# Patient Record
Sex: Female | Born: 1975 | Race: White | Hispanic: No | Marital: Married | Smoking: Former smoker
Health system: Southern US, Community
[De-identification: ages and names within clinical notes are randomized; demographics above are authoritative.]

## PROBLEM LIST (undated history)

## (undated) DIAGNOSIS — Z789 Other specified health status: Secondary | ICD-10-CM

## (undated) HISTORY — PX: TONSILLECTOMY: SUR1361

## (undated) HISTORY — PX: APPENDECTOMY: SHX54

## (undated) HISTORY — PX: COSMETIC SURGERY: SHX468

## (undated) HISTORY — PX: BREAST SURGERY: SHX581

---

## 2009-06-05 ENCOUNTER — Ambulatory Visit: Payer: Self-pay | Admitting: Diagnostic Radiology

## 2009-06-05 ENCOUNTER — Emergency Department (HOSPITAL_BASED_OUTPATIENT_CLINIC_OR_DEPARTMENT_OTHER): Admission: EM | Admit: 2009-06-05 | Discharge: 2009-06-05 | Payer: Self-pay | Admitting: Emergency Medicine

## 2010-06-09 IMAGING — CR DG CHEST 2V
2 series · 2 of 2 positions shown · non-contrast
Comparison: None

CLINICAL DATA: Cough with congestion; difficulty breathing.

CHEST - 2 VIEW

[w chest pa]
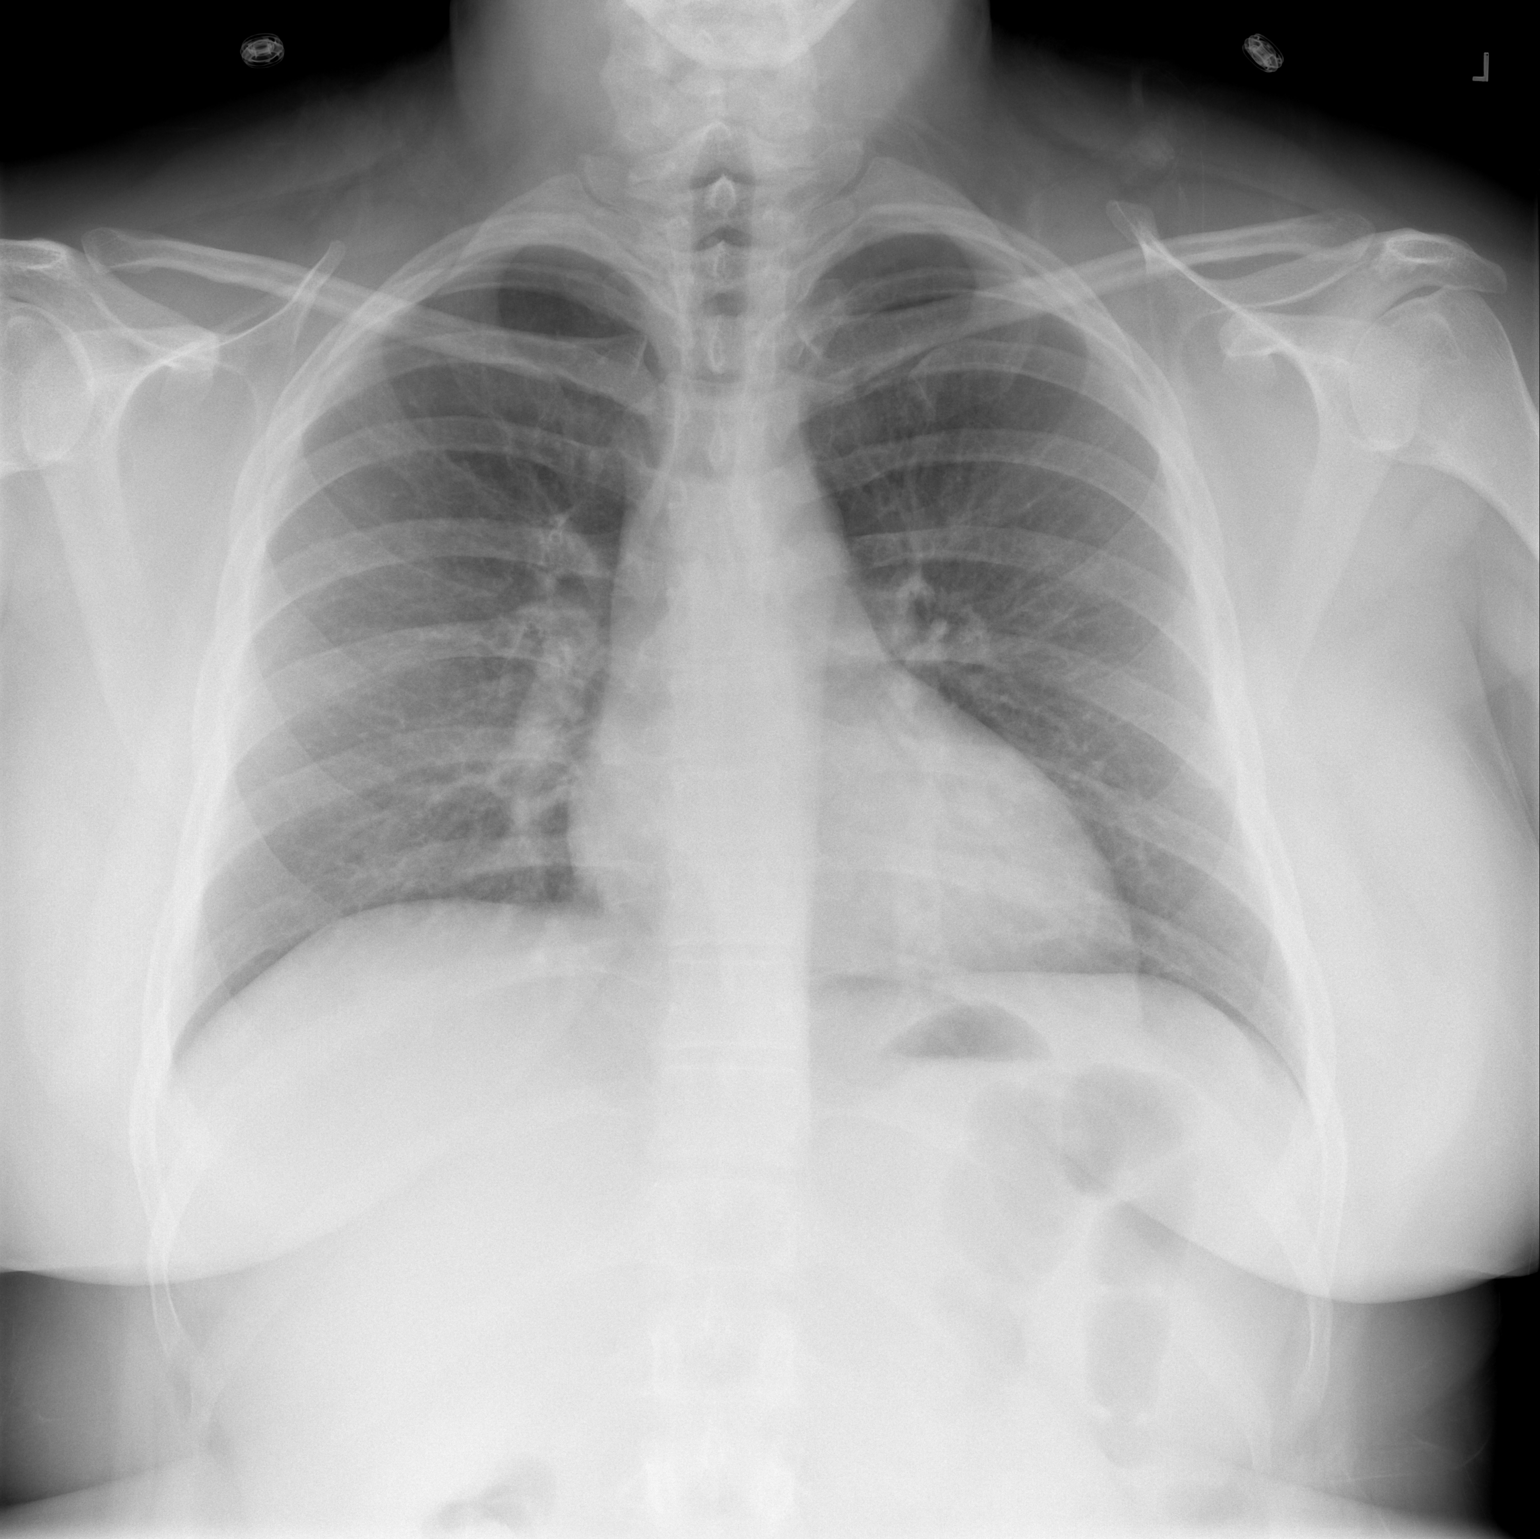

[w chest lat]
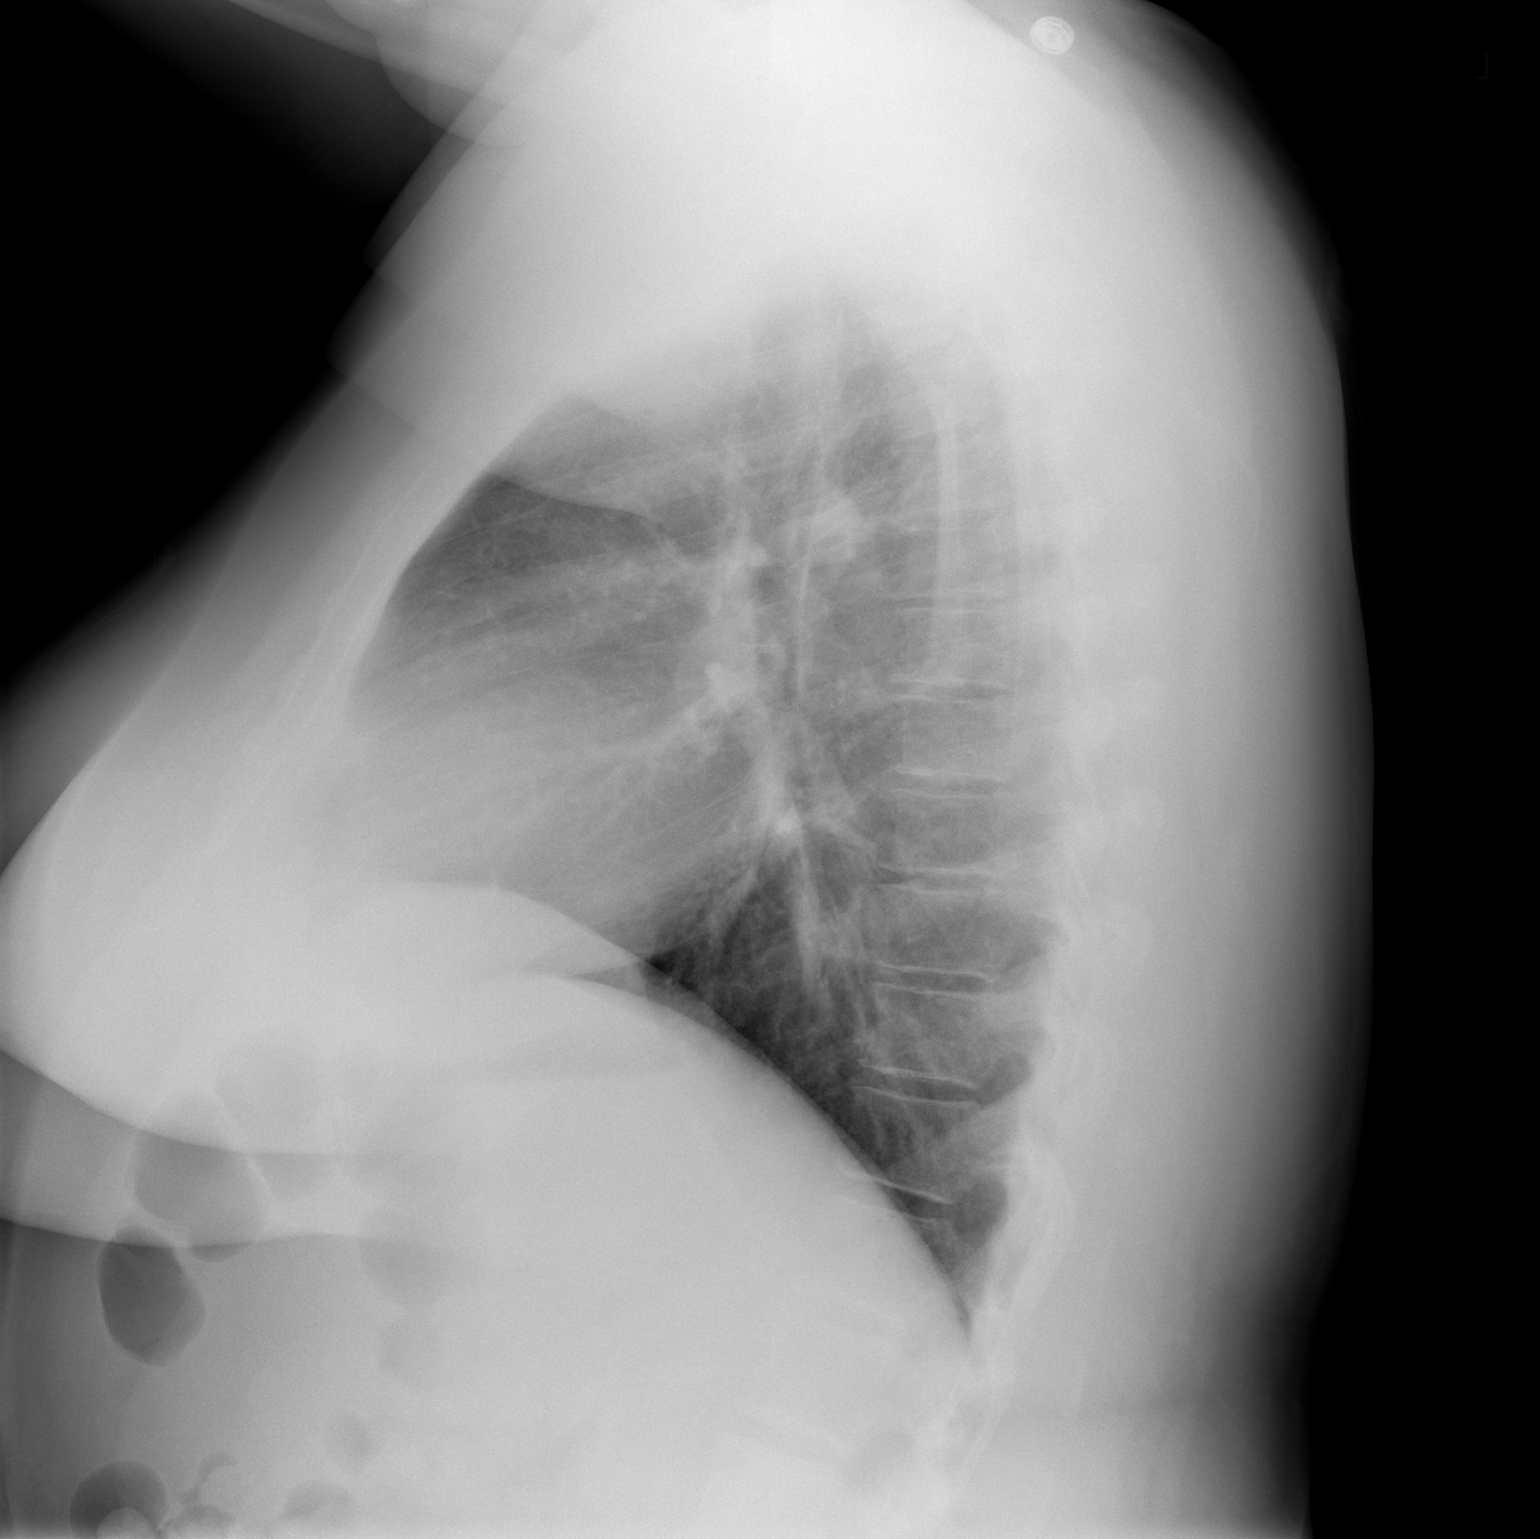

[2 of 2 positions shown; findings below may reference images not displayed]

FINDINGS: The lungs are well-aerated and clear.  There is no
evidence of focal opacification, pleural effusion or pneumothorax.

The heart is normal in size; the mediastinal contour is within
normal limits.  No acute osseous abnormalities are seen.
IMPRESSION: No acute cardiopulmonary process seen.

## 2016-05-06 ENCOUNTER — Encounter (HOSPITAL_COMMUNITY): Payer: Self-pay | Admitting: Obstetrics and Gynecology

## 2016-05-06 ENCOUNTER — Other Ambulatory Visit (HOSPITAL_COMMUNITY): Payer: Self-pay | Admitting: Obstetrics and Gynecology

## 2016-05-06 DIAGNOSIS — Z3A13 13 weeks gestation of pregnancy: Secondary | ICD-10-CM

## 2016-05-06 DIAGNOSIS — Z3682 Encounter for antenatal screening for nuchal translucency: Secondary | ICD-10-CM

## 2016-05-20 ENCOUNTER — Encounter (HOSPITAL_COMMUNITY): Payer: Self-pay | Admitting: *Deleted

## 2016-05-21 ENCOUNTER — Ambulatory Visit (HOSPITAL_COMMUNITY)
Admission: RE | Admit: 2016-05-21 | Discharge: 2016-05-21 | Disposition: A | Discharge status: Home / Self Care | Payer: BLUE CROSS/BLUE SHIELD | Source: Ambulatory Visit | Attending: Obstetrics and Gynecology | Admitting: Obstetrics and Gynecology

## 2016-05-21 ENCOUNTER — Other Ambulatory Visit (HOSPITAL_COMMUNITY): Payer: Self-pay | Admitting: Obstetrics and Gynecology

## 2016-05-21 ENCOUNTER — Encounter (HOSPITAL_COMMUNITY): Payer: Self-pay

## 2016-05-21 VITALS — BP 104/59 | HR 78 | Wt 194.2 lb

## 2016-05-21 DIAGNOSIS — O09529 Supervision of elderly multigravida, unspecified trimester: Secondary | ICD-10-CM

## 2016-05-21 DIAGNOSIS — Z3A13 13 weeks gestation of pregnancy: Secondary | ICD-10-CM

## 2016-05-21 DIAGNOSIS — O09521 Supervision of elderly multigravida, first trimester: Secondary | ICD-10-CM | POA: Insufficient documentation

## 2016-05-21 DIAGNOSIS — O99841 Bariatric surgery status complicating pregnancy, first trimester: Secondary | ICD-10-CM

## 2016-05-21 DIAGNOSIS — Z3682 Encounter for antenatal screening for nuchal translucency: Secondary | ICD-10-CM

## 2016-05-21 DIAGNOSIS — Z36 Encounter for antenatal screening of mother: Secondary | ICD-10-CM | POA: Diagnosis not present

## 2016-05-21 HISTORY — DX: Other specified health status: Z78.9

## 2016-05-21 NOTE — Progress Notes (Addendum)
Genetic Counseling  High-Risk Gestation Note  Appointment Date:  05/21/2016 Referred By: Marylen PontoMannino, [name], DO Date of Birth:  05/26/1976 Partner:  Phyllis Lewis   Pregnancy History: [id number] Estimated Date of Delivery: 11/25/16 Estimated Gestational Age: 1345w1d Attending: [name], MD   [name] and [name] were seen for genetic counseling because of a maternal age of 40 y.o.. She will be 40 years old at delivery.      In summary:  Discussed AMA and associated risk for fetal aneuploidy  Discussed options for screening  First screen-declined  Quad screen-declined  NIPS- accepted today, Panorama drawn  Ultrasound- NT ultrasound performed today  Discussed diagnostic testing options  [facility] - declined  Amniocentesis - declined  Reviewed family history concerns  Discussed carrier screening options- pt reported carrier screening panel possibly performed previously through REI provider  They were counseled regarding maternal age and the association with risk for chromosome conditions due to nondisjunction with aging of the ova.   We reviewed chromosomes, nondisjunction, and the associated 1 in 3451 risk for fetal aneuploidy related to a maternal age of 40 y.o. at 9245w1d gestation.  They were counseled that the risk for aneuploidy decreases as gestational age increases, accounting for those pregnancies which spontaneously abort.  We specifically discussed Down syndrome (trisomy 6321), trisomies 1813 and 7418, and sex chromosome aneuploidies (47,XXX and 47,XXY) including the common features and prognoses of each.   We reviewed available screening options including First Screen, Quad screen, noninvasive prenatal screening (NIPS)/cell free DNA (cfDNA) screening, and detailed ultrasound.  They were counseled that screening tests are used to modify a patient's a priori risk for aneuploidy, typically based on age. This estimate provides a pregnancy specific risk assessment. We  reviewed the benefits and limitations of each option. Specifically, we discussed the conditions for which each test screens, the detection rates, and false positive rates of each. They were also counseled regarding diagnostic testing via [facility] and amniocentesis. We reviewed the approximate 1 in 300-500 risk for complications from amniocentesis, including spontaneous pregnancy loss. We discussed the possible results that the tests might provide including: positive, negative, unanticipated, and no result. Finally, they were counseled regarding the cost of each option and potential out of pocket expenses. After consideration of all the options, they elected to proceed with NIPS ([facility]).  Those results will be available in 8-10 days.  She declined CVS, amniocentesis, and first screen.   They also expressed interest in pursuing a nuchal translucency ultrasound, which was performed today.  The report will be documented separately.  The patient would like to return for a detailed ultrasound at ~18+ weeks gestation.  This appointment was scheduled today. They understand that screening tests cannot rule out all birth defects or genetic syndromes. The patient was advised of this limitation and states she still does not want additional testing at this time.   [name] was provided with written information regarding cystic fibrosis (CF), spinal muscular atrophy (SMA) and hemoglobinopathies including the carrier frequency, availability of carrier screening and prenatal diagnosis if indicated.  In addition, we discussed that CF and hemoglobinopathies are routinely screened for as part of the [place] newborn screening panel. [name] reported that a carrier screening panel was performed approximately 3-4 years ago through Dr. Tinnie GensJeffrey Deaton's office as part of her REI evaluation and workup, and the results were within normal limits. The patient declined carrier screening today, and she  planned to attempt to obtain records  to document this previous screening and the conditions contained on the panel.    Both family histories were reviewed and found to be noncontributory for birth defects, intellectual disability, and known genetic conditions. Without further information regarding the provided family history, an accurate genetic risk cannot be calculated. Further genetic counseling is warranted if more information is obtained.  [name] denied exposure to environmental toxins or chemical agents. She denied the use of tobacco or street drugs. She reported alcohol exposure during the first 3 and a half weeks gestation and none since that time. This reported exposure would be expected to be within the "all or none period." Exposures that occur in the first 4 weeks of gestation are typically thought to either not affect the pregnancy at all or result in a miscarriage. She denied significant viral illnesses during the course of her pregnancy. Her medical and surgical histories were noncontributory.   I counseled this couple regarding the above risks and available options.  The approximate face-to-face time with the genetic counselor was 40 minutes.  [name], MS,  Certified Genetic Counselor 05/21/2016

## 2016-05-28 ENCOUNTER — Telehealth (HOSPITAL_COMMUNITY): Payer: Self-pay | Admitting: MS"

## 2016-05-28 NOTE — Telephone Encounter (Signed)
Called Phyllis Lewis to discuss her prenatal cell free DNA test results.  Mrs. Phyllis Lewis had Panorama testing through [facility].  Testing was offered because of maternal age.   The patient was identified by name and DOB.  We reviewed that these are within normal limits, showing a less than 1 in 10,000 risk for trisomies 21, 18 and 13, and monosomy X (Turner syndrome).  In addition, the risk for triploidy and sex chromosome trisomies (47,XXX and 47,XXY) was also low risk.  We reviewed that this testing identifies > 99% of pregnancies with trisomy 6421, trisomy 213, sex chromosome trisomies (47,XXX and 47,XXY), and triploidy. The detection rate for trisomy 18 is 96%.  The detection rate for monosomy X is ~92%.  The false positive rate is <0.1% for all conditions. The patient did not wish to know fetal sex.  She understands that this testing does not identify all genetic conditions.  All questions were answered to her satisfaction, she was encouraged to call with additional questions or concerns. Patient requested for permission for release of medical information form to be emailed to her to attempt to obtain records from [facility] of possible carrier screening previously performed. Patient's email address is [email or web address].   Quinn PlowmanKaren Sabriah Hobbins, MS Certified Genetic Counselor 05/28/2016 2:28 PM

## 2016-05-29 ENCOUNTER — Other Ambulatory Visit (HOSPITAL_COMMUNITY): Payer: Self-pay

## 2016-05-31 ENCOUNTER — Other Ambulatory Visit (HOSPITAL_COMMUNITY): Payer: Self-pay | Admitting: *Deleted

## 2016-06-25 ENCOUNTER — Ambulatory Visit (HOSPITAL_COMMUNITY): Payer: BLUE CROSS/BLUE SHIELD

## 2016-07-01 ENCOUNTER — Telehealth (HOSPITAL_COMMUNITY): Payer: Self-pay | Admitting: MS"

## 2016-07-01 NOTE — Telephone Encounter (Signed)
Patient called to request fetal gender be added to her Panorama report. Discussed that I will request this change from [facility] and an updated report should be issued 24-48 hours. Patient is having gender reveal party next Friday and would like ammended report with fetal gender be mailed once it is received. [name] also inquired about records from Dr. Elesa Hackereaton, where she suspects carrier screening was previously performed. Discussed that we have not received records. Patient sent in release for medical information form herself and thus, we do not have her signature on our copy of the release form. She planned to follow-up with [facility] regarding these records.   Clydie BraunKaren Loreen Bankson 07/01/2016 4:33 PM

## 2016-07-02 ENCOUNTER — Other Ambulatory Visit (HOSPITAL_COMMUNITY): Payer: Self-pay

## 2016-08-28 DIAGNOSIS — O99019 Anemia complicating pregnancy, unspecified trimester: Secondary | ICD-10-CM | POA: Insufficient documentation

## 2017-03-25 ENCOUNTER — Encounter (HOSPITAL_COMMUNITY): Payer: Self-pay

## 2017-05-20 ENCOUNTER — Ambulatory Visit (HOSPITAL_COMMUNITY)
Admission: RE | Admit: 2017-05-20 | Discharge: 2017-05-20 | Disposition: A | Discharge status: Home / Self Care | Payer: BC Managed Care – PPO | Source: Ambulatory Visit | Attending: Obstetrics and Gynecology | Admitting: Obstetrics and Gynecology

## 2017-05-20 ENCOUNTER — Encounter (HOSPITAL_COMMUNITY): Payer: Self-pay | Admitting: MS"

## 2017-05-20 DIAGNOSIS — Z3A12 12 weeks gestation of pregnancy: Secondary | ICD-10-CM | POA: Insufficient documentation

## 2017-05-20 DIAGNOSIS — Z315 Encounter for genetic counseling: Secondary | ICD-10-CM | POA: Diagnosis present

## 2017-05-20 DIAGNOSIS — O09529 Supervision of elderly multigravida, unspecified trimester: Secondary | ICD-10-CM

## 2017-05-20 NOTE — Progress Notes (Signed)
Genetic Counseling  High-Risk Gestation Note  Appointment Date:  05/20/2017 Referred By: Hassell Done, MD Date of Birth:  03-03-1976 Partner:  Emelia Loron   Pregnancy History: [id number] Estimated Date of Delivery: 12/02/17 Estimated Gestational Age: [redacted]w[redacted]d Attending: Charlsie Merles, MD   Phyllis Lewis and her husband, Mr. Pete Merten, were seen for genetic counseling because of a maternal age of 41 y.o.. She will be 41 years old at delivery. They were seen for genetic counseling in a previous pregnancy on 05/21/2016.      In summary:  Discussed AMA and associated risk for fetal aneuploidy  Discussed options for screening  First screen- declined  Quad screen- declined  NIPS- elected to pursue Panorama today  Ultrasound- detailed ultrasound to be scheduled through OB office per patient's report  Discussed diagnostic testing options  [facility]S- declined  Amniocentesis- declined  Reviewed family history concerns  Discussed carrier screening options- elected to pursue today ([facility])  CF  SMA  Hemoglobinopathies  They were counseled regarding maternal age and the association with risk for chromosome conditions due to nondisjunction with aging of the ova.   We reviewed chromosomes, nondisjunction, and the associated 1 in 25 risk for fetal aneuploidy at [redacted]w[redacted]d related to a maternal age of 41 years old at delivery.  They were counseled that the risk for aneuploidy decreases as gestational age increases, accounting for those pregnancies which spontaneously abort.  We specifically discussed Down syndrome (trisomy 1), trisomies 32 and 26, and sex chromosome aneuploidies (47,XXX and 47,XXY) including the common features and prognoses of each.   We reviewed available screening options including First Screen, Quad screen, noninvasive prenatal screening (NIPS)/cell free DNA (cfDNA) screening, and detailed ultrasound.  They were counseled that screening tests are used to modify a  patient's a priori risk for aneuploidy, typically based on age. This estimate provides a pregnancy specific risk assessment. We reviewed the benefits and limitations of each option. Specifically, we discussed the conditions for which each test screens, the detection rates, and false positive rates of each. They were also counseled regarding diagnostic testing via [facility] and amniocentesis. We reviewed the approximate 1 in 300-500 risk for complications from amniocentesis, including spontaneous pregnancy loss. We discussed the possible results that the tests might provide including: positive, negative, unanticipated, and no result. Finally, they were counseled regarding the cost of each option and potential out of pocket expenses.   After consideration of all the options, she elected to proceed with NIPS (Panorama through [facility] laboratory).  Those results will be available in 8-10 days.  She declined CVS and amniocentesis. Ultrasound was not performed at the time of today's visit. Detailed ultrasound is available to the patient at approximately [redacted] weeks gestation. This is available through our office, if desired. The patient reported that detailed ultrasound will be facilitated through her OB office. They understand that screening tests cannot rule out all birth defects or genetic syndromes. The patient was advised of this limitation and states she still does not want additional testing at this time.   Phyllis Lewis was provided with written information regarding cystic fibrosis (CF), spinal muscular atrophy (SMA) and hemoglobinopathies including the carrier frequency, availability of carrier screening and prenatal diagnosis if indicated.  In addition, we discussed that CF and hemoglobinopathies are routinely screened for as part of the  newborn screening panel.  After further discussion, she elected to pursue screening for CF, SMA and hemoglobinopathies today through [facility] laboratory. These results will be  available in approximately  2 weeks.   Both family histories were reviewed and found to be noncontributory for updates regarding birth defects, intellectual disability, and known genetic conditions. The couple's daughter is 766 months old and reportedly healthy. See previous genetic counseling note from 05/21/2016 for previous discussion regarding family history. Without further information regarding the provided family history, an accurate genetic risk cannot be calculated. Further genetic counseling is warranted if more information is obtained.  Mrs. [employer]mber Lessner denied exposure to environmental toxins or chemical agents. She denied the use of alcohol, tobacco or street drugs. She denied significant viral illnesses during the course of her pregnancy. Her medical and surgical histories were contributory for bleeding in the current pregnancy. She has been evaluated by her OB regarding this history and reported having an ultrasound yesterday that visualized subchorionic hemorrhage.    I counseled this couple regarding the above risks and available options.  The approximate face-to-face time with the genetic counselor was 30 minutes.  Quinn PlowmanKaren Ezzie Senat, MS,  Certified Genetic Counselor 05/20/2017

## 2017-05-26 ENCOUNTER — Telehealth (HOSPITAL_COMMUNITY): Payer: Self-pay | Admitting: MS"

## 2017-05-26 ENCOUNTER — Other Ambulatory Visit: Payer: Self-pay

## 2017-05-26 NOTE — Telephone Encounter (Signed)
Called [name] to discuss her prenatal cell free DNA test results.  [name] had Panorama testing through [facility].  Testing was offered because of advanced maternal age.   The patient was identified by name and DOB.  We reviewed that these are within normal limits, showing a less than 1 in 10,000 risk for trisomies 21, 18 and 13, and monosomy X (Turner syndrome).  In addition, the risk for triploidy and sex chromosome trisomies (47,XXX and 47,XXY) was also low risk.  We reviewed that this testing identifies > 99% of pregnancies with trisomy 7921, trisomy 3813, sex chromosome trisomies (47,XXX and 47,XXY), and triploidy. The detection rate for trisomy 18 is 96%.  The detection rate for monosomy X is ~92%.  The false positive rate is <0.1% for all conditions. Testing was also consistent with female fetal sex.  The patient did not wish to know fetal sex at this time, but gave permission to disclose this information to her mother who was also present for the phone call.  She understands that this testing does not identify all genetic conditions.  All questions were answered to her satisfaction, she was encouraged to call with additional questions or concerns.  Quinn PlowmanKaren Rollo Farquhar, MS Certified Genetic Counselor 05/26/2017 1:30 PM

## 2017-05-29 ENCOUNTER — Telehealth (HOSPITAL_COMMUNITY): Payer: Self-pay | Admitting: MS"

## 2017-05-29 ENCOUNTER — Ambulatory Visit (HOSPITAL_COMMUNITY): Payer: BLUE CROSS/BLUE SHIELD

## 2017-05-29 NOTE — Telephone Encounter (Signed)
Called Ms. Jiali Salemi to discuss her carrier screening results. Mrs. Loleta ChanceHill had carrier screening for the ACOG recommended conditions (SMA, CF, and hemoglobinopathies) through [facility]. The patient was identified by name and DOB. We reviewed that the results are negative, indicating that she does not have a detectable gene alteration in any of the genes for which analysis was performed. We reviewed that carrier screening does not detect all carriers of these conditions, but a normal result significantly decreases the likelihood of being a carrier, and therefore, the overall reproductive risk. We reviewed that [facility] sequences most of the genes, which is associated with a high detection rate for carriers, thus a negative screen is very reassuring. All questions were answered to her satisfaction, she was encouraged to call with additional questions or concerns. ? Quinn PlowmanKaren Dishon Kehoe, MS Patent attorneyCertified Genetic Counselor

## 2017-06-11 ENCOUNTER — Other Ambulatory Visit (HOSPITAL_COMMUNITY): Payer: Self-pay

## 2017-07-18 IMAGING — US US MFM FETAL NUCHAL TRANSLUCENCY
1 series · 15 of 28 positions shown · non-contrast
Comparison: none

[Series 1: us mfm fetal nuchal translucency · 15 of 35 slices shown]
[im 1/35]
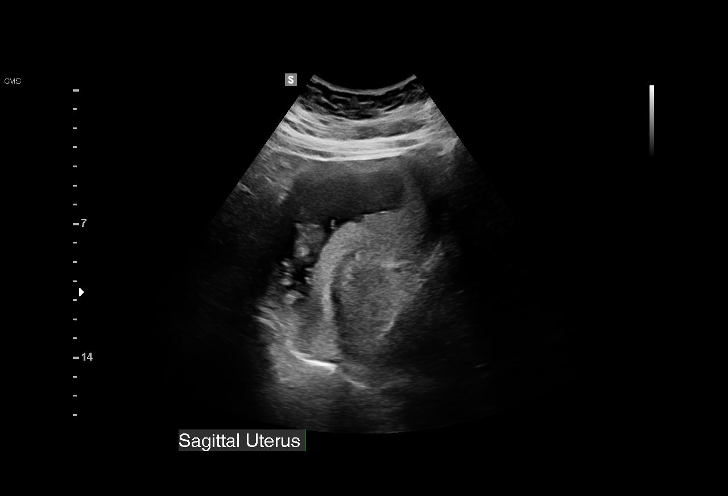
[im 3/35]
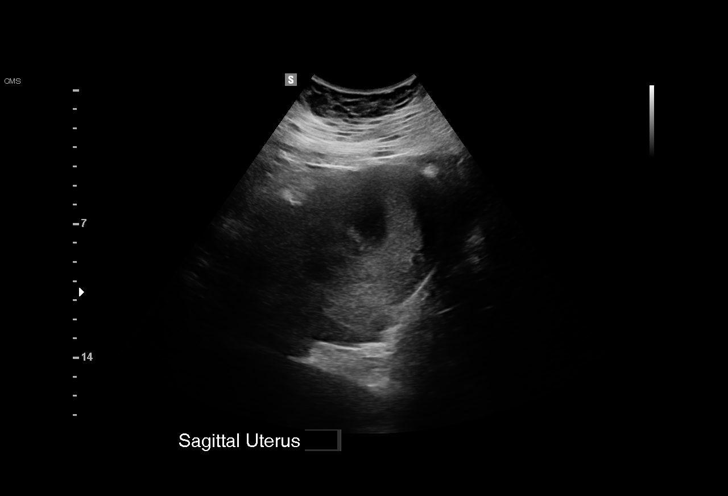
[im 6/35]
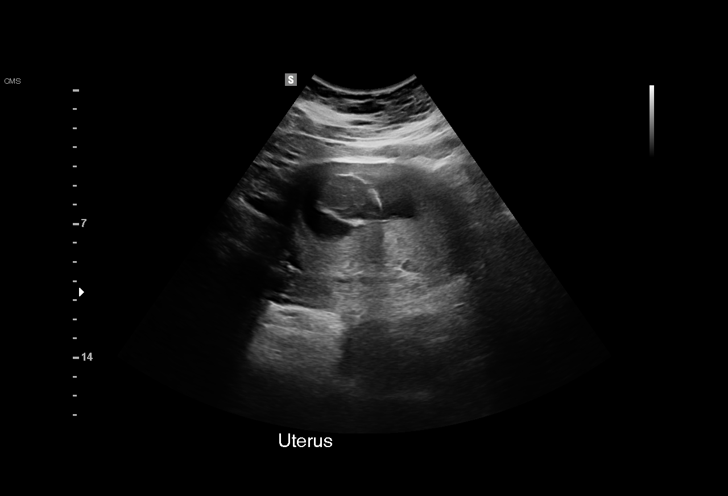
[im 8/35]
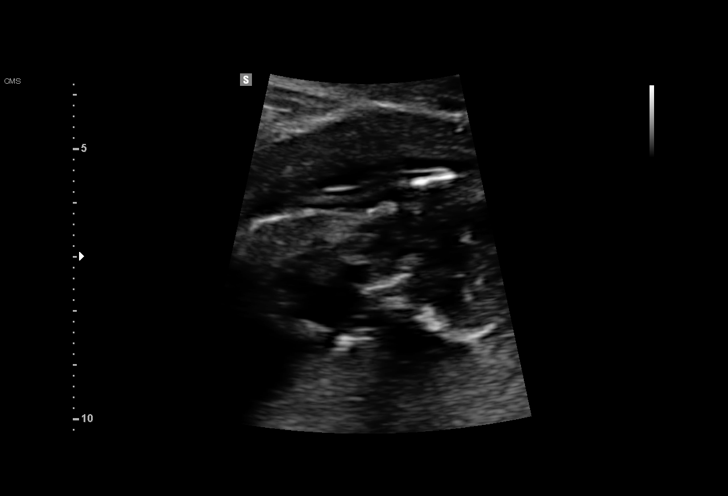
[im 11/35]
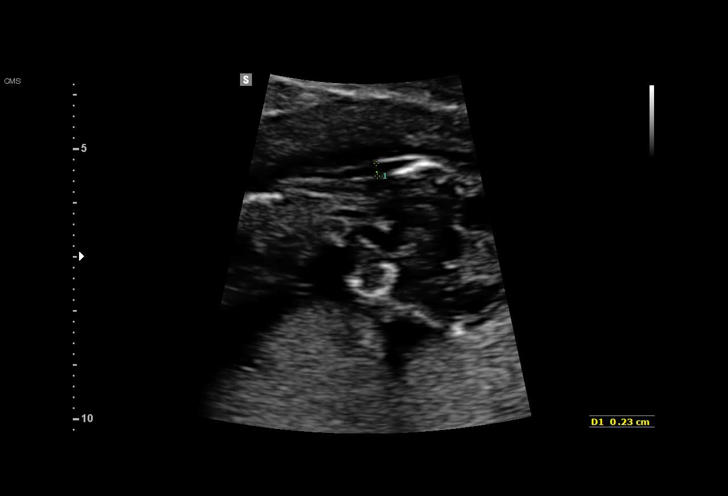
[im 13/35]
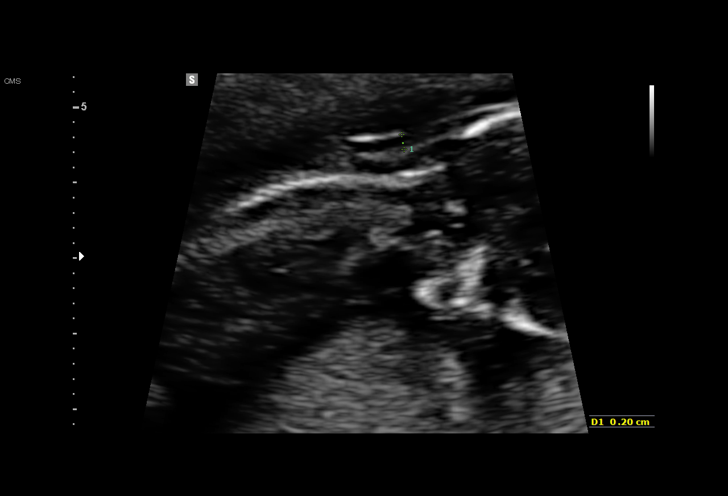
[im 16/35]
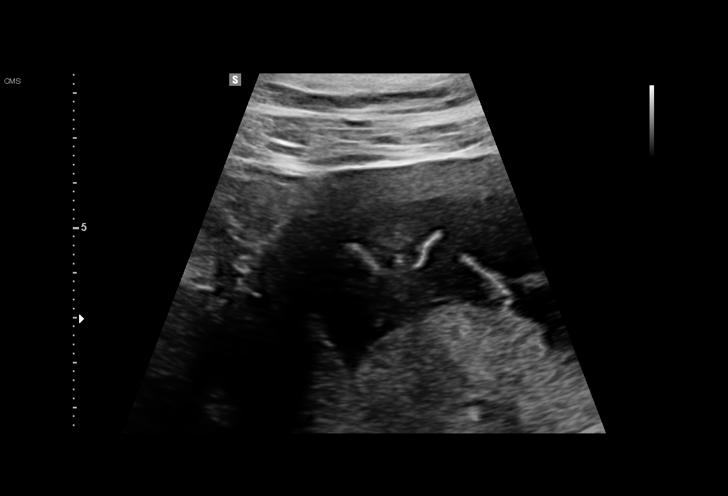
[im 18/35]
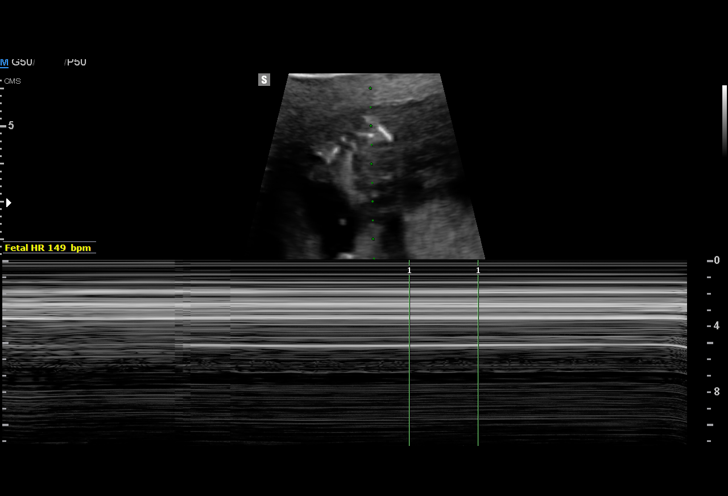
[im 19/35]
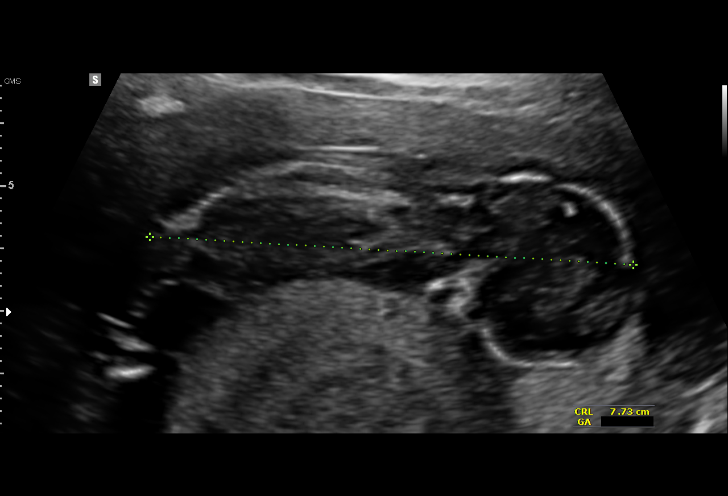
[im 22/35]
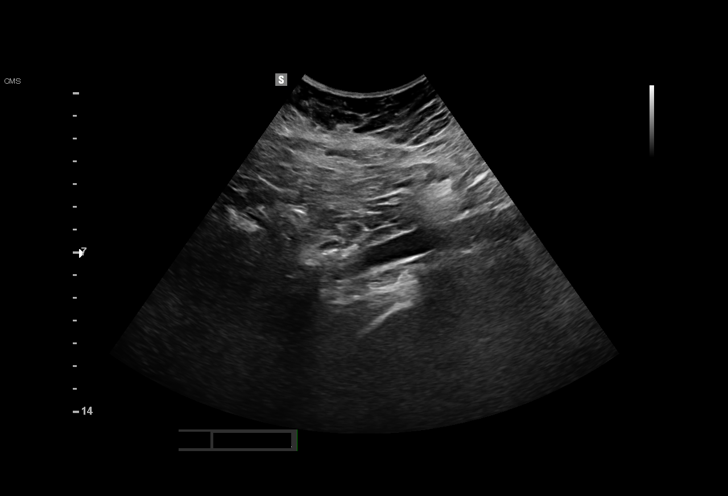
[im 24/35]
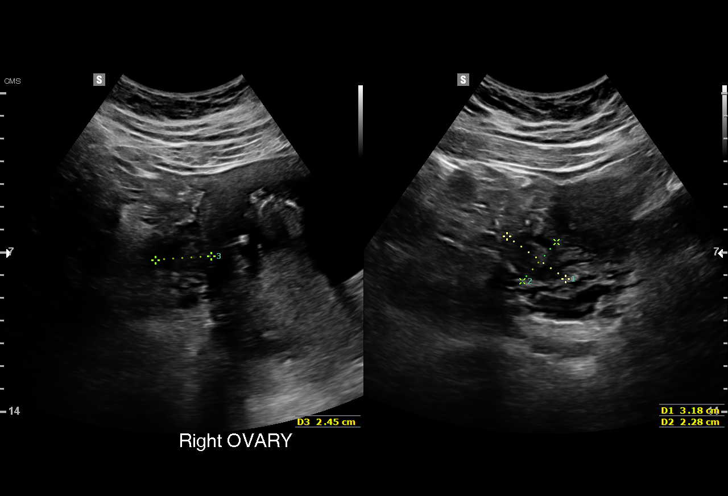
[im 27/35]
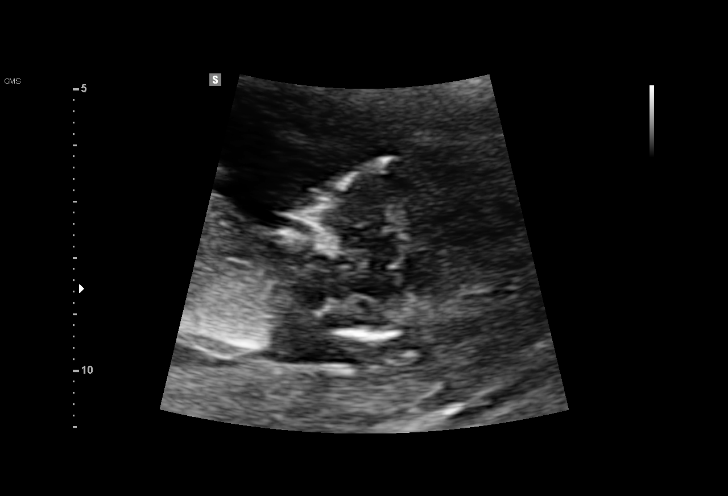
[im 29/35]
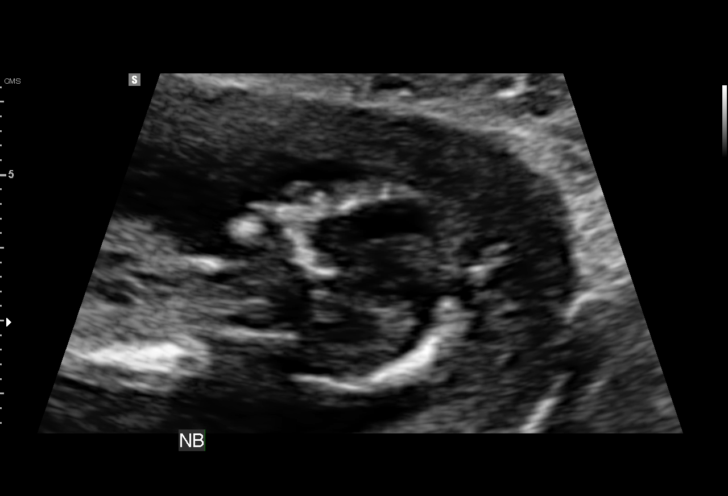
[im 32/35]
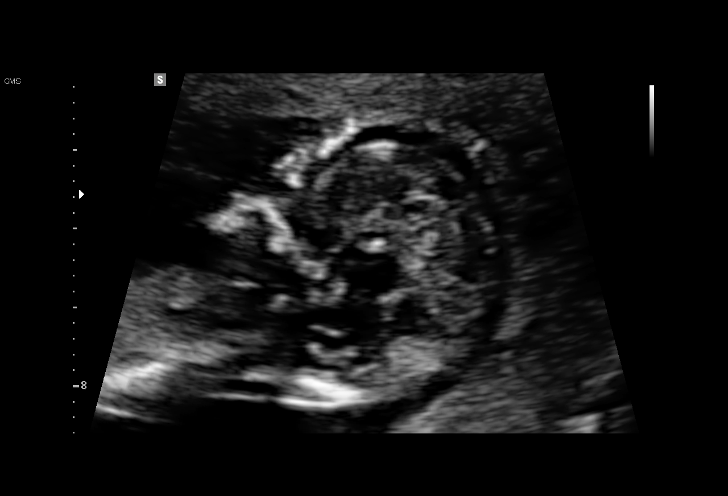
[im 35/35]
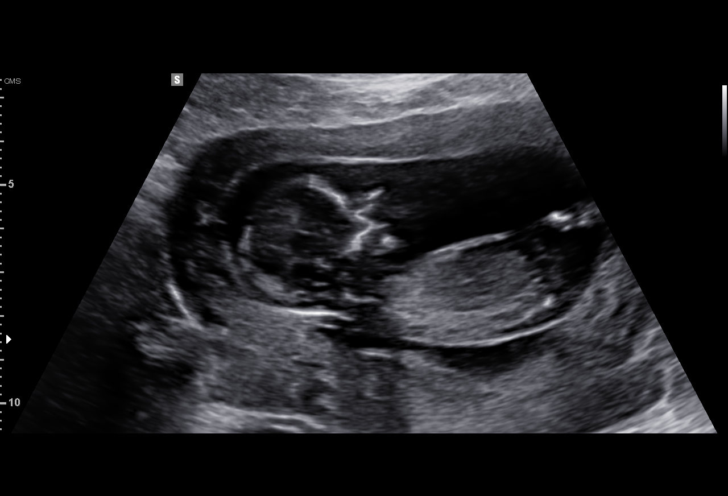

[15 of 28 positions shown; findings below may reference images not displayed]

MIO DO

TRANSLUCENCY

1  M SKOP GACESA           [id number]       [id number]     [id number]
Indications

13 weeks gestation of pregnancy
First trimester aneuploidy screen (NT)         Z36
Advanced maternal age multigravida 39, first
trimester
Pregnancy complicated by previous gastric
bypass, antepartum, first trimester
OB History

Gravidity:    3         Term:   0        Prem:   0        SAB:   0
TOP:          2       Ectopic:  0        Living: 0
Fetal Evaluation

Num Of Fetuses:     1
Preg. Location:     Intrauterine
Gest. Sac:          Intrauterine
Fetal Pole:         Visualized
Fetal Heart         149
Rate(bpm):
Cardiac Activity:   Observed
Biometry

CRL:      77.3  mm     G. Age:  13w 3d                  EDD:   11/23/16
Gestational Age

LMP:           13w 5d       Date:   02/15/16                 EDD:   11/21/16
Best:          13w 1d    Det. By:   Early Ultrasound         EDD:   11/25/16
(04/02/16)
1st Trimester Genetic Sonogram Screening

CRL:            77.3  mm    G. Age:   13w 3d                 EDD:   11/23/16
Nuc Trans:       2.5  mm
Nasal Bone:                 Present
Cervix Uterus Adnexa

Cervix
Closed.

Uterus
No abnormality visualized.

Left Ovary
Not visualized.

Right Ovary
Within normal limits.

Cul De Sac:   No free fluid seen.

Adnexa:       No abnormality visualized.
Impression

Single IUP at 13w 1d
Advanced maternal age, history  of bariatric surgery
Normal NT (2.5 mm).  Nasal bone visualized
See separate note from genetics counselor - the patient
elected to undergo NIPT
Recommendations

Would offer MSAFP to screen for open neural tube defects at
15-20 weeks
Ultrasound for anatomy at 18-20 weeks (scheduled)

## 2017-09-29 DIAGNOSIS — O24410 Gestational diabetes mellitus in pregnancy, diet controlled: Secondary | ICD-10-CM | POA: Insufficient documentation

## 2018-02-24 ENCOUNTER — Encounter (HOSPITAL_COMMUNITY): Payer: Self-pay

## 2018-03-18 DIAGNOSIS — R220 Localized swelling, mass and lump, head: Secondary | ICD-10-CM | POA: Insufficient documentation

## 2020-11-16 DIAGNOSIS — Z1211 Encounter for screening for malignant neoplasm of colon: Secondary | ICD-10-CM | POA: Insufficient documentation

## 2021-10-18 ENCOUNTER — Ambulatory Visit: Payer: BC Managed Care – PPO | Admitting: Podiatry

## 2021-10-18 ENCOUNTER — Other Ambulatory Visit: Payer: Self-pay

## 2021-10-18 ENCOUNTER — Encounter: Payer: Self-pay | Admitting: Podiatry

## 2021-10-18 DIAGNOSIS — B351 Tinea unguium: Secondary | ICD-10-CM

## 2021-10-18 MED ORDER — FLUCONAZOLE 150 MG PO TABS: 150.0000 mg | ORAL_TABLET | ORAL | 2 refills | Status: DC

## 2021-10-18 NOTE — Progress Notes (Signed)
°  Subjective:  Patient ID: Phyllis Lewis, female    DOB: [birth year],  MRN: [id number]  Chief Complaint  Patient presents with   Nail Problem    Both big toenails are thick and discolored and I go get the acrylic nails and I went to [facility] and they gave me a nail lacquer to use    46 y.o. female presents with the above complaint. History confirmed with patient. Had nail removal about 5 years ago and noted they grew back thick and yellow. 3 weeks ago was seen at [facility] and she was started on Penlac.  Objective:  Physical Exam: warm, good capillary refill, no trophic changes or ulcerative lesions, normal DP and PT pulses, and normal sensory exam. Bilateral nails mildly dystrophic - left distally, right about 50% of the nail. Mild lysis. Subungual debris. Yellow discoloration. Assessment:   1. Onychomycosis    Plan:  Patient was evaluated and treated and all questions answered.  Onychomycosis  -Educated on etiology of nail fungus. -Nail sample taken for microbiology and histology. -Not a great candidate for lamisil given daily alcohol use - she would have to stop this to start the medication -eRx for fluconazole weekly. Educated on risks and benefits of the medication.    Return in about 1 month (around 11/18/2021) for Nail Fungus.

## 2021-10-23 NOTE — Addendum Note (Signed)
Addended by: Hadley Pen R on: 10/23/2021 12:18 PM   Modules accepted: Orders

## 2021-11-19 ENCOUNTER — Encounter: Payer: Self-pay | Admitting: Podiatry

## 2021-11-19 ENCOUNTER — Ambulatory Visit: Payer: BC Managed Care – PPO | Admitting: Podiatry

## 2021-11-19 DIAGNOSIS — B351 Tinea unguium: Secondary | ICD-10-CM

## 2021-11-19 MED ORDER — FLUCONAZOLE 150 MG PO TABS: 150.0000 mg | ORAL_TABLET | ORAL | 2 refills | Status: AC

## 2021-11-19 NOTE — Progress Notes (Signed)
°  Subjective:  Patient ID: Phyllis Lewis, female    DOB: 1976/07/10,  MRN: [id number]  Chief Complaint  Patient presents with   Nail Problem    The toenails are looking healthier on the back end of the toenails on big toes and I have taken the medicine and using the nail polish every day     46 y.o. female presents with the above complaint. History confirmed with patient. Thinks the nails are improving. Taking medication and denies side effects. No new complaints.  Objective:  Physical Exam: warm, good capillary refill, no trophic changes or ulcerative lesions, normal DP and PT pulses, and normal sensory exam. Bilateral nails mildly dystrophic distally with proximal clearing. Yellow discoloration. Assessment:   1. Onychomycosis    Plan:  Patient was evaluated and treated and all questions answered.  Onychomycosis  -Improving. -Culture/histo pending. -Debrided nails of mycotic nail -Refill fluconazole. Continue current regimen. -Will f/u in 6 weeks to assess further growth  No follow-ups on file.

## 2021-11-27 ENCOUNTER — Telehealth: Payer: Self-pay | Admitting: *Deleted

## 2021-11-27 NOTE — Telephone Encounter (Signed)
-----   Message from Park Liter, DPM sent at 11/19/2021  9:41 AM EST ----- Can we call [facility] to f/u her nail sample? I don't see results.

## 2021-11-27 NOTE — Telephone Encounter (Signed)
We got the results and Phyllis Lewis scanned to the chart. Misty Stanley

## 2021-12-31 ENCOUNTER — Ambulatory Visit: Payer: BC Managed Care – PPO | Admitting: Podiatry

## 2021-12-31 ENCOUNTER — Other Ambulatory Visit: Payer: Self-pay

## 2021-12-31 ENCOUNTER — Encounter: Payer: Self-pay | Admitting: Podiatry

## 2021-12-31 DIAGNOSIS — B351 Tinea unguium: Secondary | ICD-10-CM | POA: Diagnosis not present

## 2021-12-31 NOTE — Progress Notes (Signed)
?  Subjective:  ?Patient ID: Phyllis Lewis, female    DOB: July 10, 1976,  MRN: [id number] ? ?Chief Complaint  ?Patient presents with  ? Nail Problem  ?   6 week follow up nail fungus  ? ? ?46 y.o. female presents with the above complaint. History confirmed with patient. Thinks the nails are improving. Taking medication and denies side effects. No new complaints. ? ?Objective:  ?Physical Exam: ?warm, good capillary refill, no trophic changes or ulcerative lesions, normal DP and PT pulses, and normal sensory exam. Bilateral nails mildly dystrophic distally with proximal clearing. Yellow discoloration. ?Assessment:  ? ?No diagnosis found. ? ?Plan:  ?Patient was evaluated and treated and all questions answered. ? ?Onychomycosis  ?-Improving. ?-Debrided nails of mycotic nail ?-Continue course of flucanazole  ?-Continue with penlac.  ?-Will f/u as neeeded ? ?No follow-ups on file.  ? ?

## 2023-01-22 ENCOUNTER — Encounter: Payer: Self-pay | Admitting: Podiatry

## 2023-01-22 ENCOUNTER — Ambulatory Visit (INDEPENDENT_AMBULATORY_CARE_PROVIDER_SITE_OTHER): Payer: BC Managed Care – PPO

## 2023-01-22 ENCOUNTER — Ambulatory Visit: Payer: BC Managed Care – PPO | Admitting: Podiatry

## 2023-01-22 DIAGNOSIS — Q828 Other specified congenital malformations of skin: Secondary | ICD-10-CM

## 2023-01-22 DIAGNOSIS — M7661 Achilles tendinitis, right leg: Secondary | ICD-10-CM | POA: Diagnosis not present

## 2023-01-22 DIAGNOSIS — M79671 Pain in right foot: Secondary | ICD-10-CM

## 2023-01-22 DIAGNOSIS — M7662 Achilles tendinitis, left leg: Secondary | ICD-10-CM

## 2023-01-22 DIAGNOSIS — M79672 Pain in left foot: Secondary | ICD-10-CM | POA: Diagnosis not present

## 2023-01-22 DIAGNOSIS — R52 Pain, unspecified: Secondary | ICD-10-CM

## 2023-01-22 MED ORDER — MELOXICAM 15 MG PO TABS: 15.0000 mg | ORAL_TABLET | Freq: Every day | ORAL | 0 refills | Status: DC

## 2023-01-22 NOTE — Progress Notes (Signed)
  Subjective:  Patient ID: Phyllis Lewis, female    DOB: [birth year],   MRN: [id number]  Chief Complaint  Patient presents with   Foot Pain    Pain located in the heel of foot, possible bone spur, right over left, shoes rub on the area, left 5th digit has a possible callouse     47 y.o. female presents for concern of right heel pain and some left heel pain. Relates this has been going on for about a year in the heel and relates she feels like her heel spurs have been growing. Also relates the callus on her left fifth toe has worsened and that started a few weeks ago.  . Denies any other pedal complaints. Denies n/v/f/c.   Past Medical History:  Diagnosis Date   Medical history non-contributory     Objective:  Physical Exam: Vascular: DP/PT pulses 2/4 bilateral. CFT <3 seconds. Normal hair growth on digits. No edema.  Skin. No lacerations or abrasions bilateral feet. Hypekeratotic cored lesion noted to the plantar fifth metatarsal head.  Musculoskeletal: MMT 5/5 bilateral lower extremities in DF, PF, Inversion and Eversion. Deceased ROM in DF of ankle joint. Tender to the insertion of the achilles tendon bilateral more so on the right. No pain proximally along the tendon. No pain with DF/Pf.  Neurological: Sensation intact to light touch.   Assessment:   1. Achilles tendonitis, bilateral   2. Porokeratosis      Plan:  Patient was evaluated and treated and all questions answered. -Xrays reviewed. No acute fractures or dislocations noted. Spurring noted to inferior and posterior calcaneus noted.  -Discussed Achilles insertional tendonitis and treatment options with patient.  -Discussed stretching exercises. -Rx Meloxicam provided  -Heel lifts provided and discussed proper shoewear.  -Discussed if no improvement will consider MRI/PT/EPAT/PRP injections.  -Discussed porokeratosis with patient and treatment options.  -Hyperkeratotic tissue was debrided with chisel without incident as  courtesy.  -Applied salycylic acid treatment to area with dressing. Advised to remove bandaging tomorrow.  -Encouraged daily moisturizing -Discussed use of pumice stone -Advised good supportive shoes and insert -Patient to return to office in 6 weeks for recheck.    Louann Sjogren, DPM

## 2023-01-22 NOTE — Patient Instructions (Signed)

## 2023-03-05 ENCOUNTER — Ambulatory Visit: Payer: BC Managed Care – PPO | Admitting: Podiatry

## 2023-03-06 ENCOUNTER — Ambulatory Visit: Payer: BC Managed Care – PPO | Admitting: Podiatry

## 2023-06-13 ENCOUNTER — Ambulatory Visit
Admission: EM | Admit: 2023-06-13 | Discharge: 2023-06-13 | Disposition: A | Discharge status: Home / Self Care | Payer: BC Managed Care – PPO | Attending: Internal Medicine | Admitting: Internal Medicine

## 2023-06-13 DIAGNOSIS — Z113 Encounter for screening for infections with a predominantly sexual mode of transmission: Secondary | ICD-10-CM | POA: Diagnosis not present

## 2023-06-13 NOTE — ED Provider Notes (Signed)
[facility]  Note:  This document was prepared using Conservation officer, historic buildings and may include unintentional dictation errors.  MRN: [id number] DOB: Jul 14, 1976  Subjective:   Phyllis Lewis is a 47 y.o. female presenting for STI testing. Denies fever, n/v, abdominal pain, pelvic pain, rashes, dysuria, urinary frequency, hematuria, vaginal discharge.  Unfortunately, she just found out that her husband was having an affair and wants to make sure that she gets tested.  She is interested in HIV and syphilis testing but unfortunately she has to leave the clinic to pick up her children and plans on returning for this.  No current facility-administered medications for this encounter.  Current Outpatient Medications:    ALPRAZolam (XANAX) 0.25 MG tablet, Take 0.25 mg by mouth daily as needed., Disp: , Rfl:    ARIPiprazole (ABILIFY) 5 MG tablet, Take by mouth., Disp: , Rfl:    azithromycin (ZITHROMAX) 250 MG tablet, Take by mouth., Disp: , Rfl:    BLISOVI FE 1/20 1-20 MG-MCG tablet, Take 1 tablet by mouth daily., Disp: , Rfl:    Cholecalciferol 25 MCG (1000 UT) capsule, Take by mouth., Disp: , Rfl:    ciclopirox (PENLAC) 8 % solution, Apply topically., Disp: , Rfl:    clobetasol cream (TEMOVATE) 0.05 %, Apply thin layer to affected area nightly for 6 weeks, then every other night for 6 weeks, then weekly PRN, Disp: , Rfl:    cyanocobalamin (,VITAMIN B-12,) 1000 MCG/ML injection, Inject 1,000 mcg into the muscle every 30 (thirty) days., Disp: , Rfl:    Fe Fum-FA-B Cmp-C-Zn-Mg-Mn-Cu (HEMOCYTE PLUS) 106-1 MG CAPS, Take 1 capsule by mouth daily., Disp: , Rfl:    ferrous sulfate 325 (65 FE) MG tablet, Take by mouth., Disp: , Rfl:    fluconazole (DIFLUCAN) 150 MG tablet, Take 1 tablet (150 mg total) by mouth once a week., Disp: 4 tablet, Rfl: 2   GLOBAL EASE INJECT PEN NEEDLES 32G X 4 MM MISC, daily., Disp: , Rfl:    meloxicam (MOBIC) 15 MG tablet, Take 1 tablet (15 mg total)  by mouth daily., Disp: 30 tablet, Rfl: 0   Multiple Vitamin (ONE DAILY) tablet, Take by mouth., Disp: , Rfl:    Omeprazole (PRILOSEC PO), Take by mouth., Disp: , Rfl:    omeprazole (PRILOSEC) 10 MG capsule, Take by mouth., Disp: , Rfl:    omeprazole (PRILOSEC) 40 MG capsule, Take 40 mg by mouth 2 (two) times daily., Disp: , Rfl:    Prenatal Vit-Fe Fumarate-FA (PRENATAL VITAMIN PO), Take by mouth., Disp: , Rfl:    PROGESTERONE VA, Place vaginally., Disp: , Rfl:    SAXENDA 18 MG/3ML SOPN, SMARTSIG:2.4 Milligram(s) SUB-Q Daily, Disp: , Rfl:    Allergies  Allergen Reactions   Penicillins     Past Medical History:  Diagnosis Date   Medical history non-contributory      Past Surgical History:  Procedure Laterality Date   APPENDECTOMY     BREAST SURGERY     COSMETIC SURGERY     TONSILLECTOMY      No family history on file.  Social History   Tobacco Use   Smoking status: Former   Smokeless tobacco: Never  Advertising account planner   Vaping status: Never Used  Substance Use Topics   Alcohol use: No   Drug use: No    ROS   Objective:   Vitals: BP (!) 138/91 (BP Location: Right Arm)   Pulse 69   Temp 98.8 F (37.1 C) (Oral)  Resp 16   LMP 05/23/2023 (Approximate)   SpO2 99%   Physical Exam Constitutional:      General: She is not in acute distress.    Appearance: Normal appearance. She is well-developed. She is not ill-appearing, toxic-appearing or diaphoretic.  HENT:     Head: Normocephalic and atraumatic.     Nose: Nose normal.     Mouth/Throat:     Mouth: Mucous membranes are moist.  Eyes:     General: No scleral icterus.       Right eye: No discharge.        Left eye: No discharge.     Extraocular Movements: Extraocular movements intact.  Cardiovascular:     Rate and Rhythm: Normal rate.  Pulmonary:     Effort: Pulmonary effort is normal.  Skin:    General: Skin is warm and dry.  Neurological:     General: No focal deficit present.     Mental Status: She is  alert and oriented to person, place, and time.  Psychiatric:        Mood and Affect: Mood normal.        Behavior: Behavior normal.     Assessment and Plan :   PDMP not reviewed this encounter.  1. Screen for STD (sexually transmitted disease)    Will try to do a nurse visit only for blood work if patient returns to have HIV and syphilis testing.  Unfortunately, she got a call from her kids that she had to go pick them up and had to leave abruptly.  Vaginal cytology pending.   Wallis Bamberg, [place] 06/13/23 1623

## 2023-06-13 NOTE — ED Triage Notes (Signed)
Pt requesting STD testing-denies sx-NAD-steady gait

## 2023-06-19 LAB — CERVICOVAGINAL ANCILLARY ONLY
Chlamydia: NEGATIVE
Comment: NEGATIVE
Comment: NEGATIVE
Comment: NORMAL
Neisseria Gonorrhea: NEGATIVE
Trichomonas: NEGATIVE

## 2024-08-04 ENCOUNTER — Ambulatory Visit: Admitting: Podiatry

## 2024-08-04 DIAGNOSIS — S90212A Contusion of left great toe with damage to nail, initial encounter: Secondary | ICD-10-CM

## 2024-08-04 DIAGNOSIS — M79675 Pain in left toe(s): Secondary | ICD-10-CM | POA: Diagnosis not present

## 2024-08-04 MED ORDER — MUPIROCIN 2 % EX OINT: TOPICAL_OINTMENT | CUTANEOUS | 1 refills | Status: AC

## 2024-08-04 MED ORDER — MELOXICAM 15 MG PO TABS
15.0000 mg | ORAL_TABLET | Freq: Every day | ORAL | 0 refills | Status: AC
Start: 2024-08-04 — End: ?

## 2024-08-04 NOTE — Progress Notes (Signed)
     Chief Complaint  Patient presents with   Nail Problem    Left hallux, total nail may need to come off. It was stepped on about a month ago.  It is dark and partial lifted from nail bed.  Not diabetic, no anti coag.     HPI: 48 y.o. female presents today with concern of redness to the toe.  She states that she was getting a pedicure this morning and her pedicurist advised her to make an appointment to get the toe checked out.  She notes her husband stepped on her left great toe a couple of weeks ago.  She notes that she no longer has pain unless there is a lot of pressure placed on the toenail.  She denies any recent drainage.  She has not been performing any soaks or applying any ointment to the area.  Past Medical History:  Diagnosis Date   Medical history non-contributory    Past Surgical History:  Procedure Laterality Date   APPENDECTOMY     BREAST SURGERY     COSMETIC SURGERY     TONSILLECTOMY     Allergies  Allergen Reactions   Penicillins     Physical Exam: Palpable pedal pulses noted on the left foot.  There is distal onycholysis noted to the left hallux nail.  Evidence of previous blood drainage underneath the nail is noted but this is dry and stable at this time.  There is some surrounding erythema with minimal edema to the periungual area.  Assessment/Plan of Care: 1. Contusion of left great toe with damage to nail, initial encounter   2. Pain in left toe(s)      Meds ordered this encounter  Medications   mupirocin ointment (BACTROBAN) 2 %    Sig: Apply sparingly along the toenail margin once daily    Dispense:  15 g    Refill:  1   meloxicam  (MOBIC ) 15 MG tablet    Sig: Take 1 tablet (15 mg total) by mouth daily.    Dispense:  20 tablet    Refill:  0   The left great toenail was carefully trimmed 50% toward the eponychium.  The distal portion was removed in toto.  The underlying dried blood was removed uneventfully.  Will get the patient started on  mupirocin ointment as well as oral meloxicam  for inflammation and any possible discomfort.  If the patient still has pain within the next 1 to 2 weeks, I would recommend avulsion of the remainder of the toenail.  At this point, there is no indication to require avulsion of the nail since she is fairly asymptomatic.  Patient expressed understanding.  Follow-up as needed   Awanda CHARM Imperial, DPM, FACFAS [facility]     [address] [place], [place] [address]                Office [phone]  Fax [phone]

## 2024-08-19 ENCOUNTER — Ambulatory Visit: Admitting: Podiatry

## 2024-08-19 DIAGNOSIS — S90212D Contusion of left great toe with damage to nail, subsequent encounter: Secondary | ICD-10-CM

## 2024-08-19 NOTE — Progress Notes (Signed)
  Chief Complaint  Patient presents with   Nail Check    Left hallux nail, it is not lose today. She has been following all instructions.  She is hoping the nail will grown back.  Not diabetic and no anti coag.    HPI: 48 y.o. female presents today for follow-up of abnormality to left great toenail.  She has taken the anti-inflammatory and use the antibiotic ointment on the toenail.  She feels that there has been significant improvement.  She is not having any pain at this time.  Denies any drainage.  Past Medical History:  Diagnosis Date   Medical history non-contributory    Past Surgical History:  Procedure Laterality Date   APPENDECTOMY     BREAST SURGERY     COSMETIC SURGERY     TONSILLECTOMY     Allergies  Allergen Reactions   Penicillins      Physical Exam: Palpable pedal pulses.  The left hallux nail shows no signs of erythema or ecchymosis today.  There is no edema appreciated in the subungual area.  There is some thickening of the nail along the distal portion.  There is no additional onycholysis noted.  No pain with compression  Assessment/Plan of Care: 1. Contusion of left great toe with damage to nail, subsequent encounter     The left hallux nail was debrided to decrease some girth and a little bit of additional length.  She does have some ciclopirox at home and will start putting this on the nail for the next 6 to 8 weeks to help prevent any fungus from getting into the nail since there was an injury previously.  Follow-up as needed   Awanda CHARM Imperial, DPM, FACFAS [facility]     [address] [place], [place] [address]                Office [phone]  Fax [phone]
